# Patient Record
Sex: Male | Born: 1970 | Race: White | Hispanic: No | Marital: Married | Smoking: Former smoker
Health system: Southern US, Community
[De-identification: ages and names within clinical notes are randomized; demographics above are authoritative.]

## PROBLEM LIST (undated history)

## (undated) DIAGNOSIS — E039 Hypothyroidism, unspecified: Secondary | ICD-10-CM

## (undated) HISTORY — PX: TOTAL HIP ARTHROPLASTY: SHX124

## (undated) HISTORY — PX: THYROID SURGERY: SHX805

---

## 2017-04-02 ENCOUNTER — Emergency Department (HOSPITAL_COMMUNITY)
Admission: EM | Admit: 2017-04-02 | Discharge: 2017-04-03 | Disposition: A | Discharge status: Home / Self Care | Payer: Medicaid - Out of State | Attending: Emergency Medicine | Admitting: Emergency Medicine

## 2017-04-02 ENCOUNTER — Encounter (HOSPITAL_COMMUNITY): Payer: Self-pay | Admitting: Emergency Medicine

## 2017-04-02 DIAGNOSIS — Z79899 Other long term (current) drug therapy: Secondary | ICD-10-CM | POA: Insufficient documentation

## 2017-04-02 DIAGNOSIS — Z96643 Presence of artificial hip joint, bilateral: Secondary | ICD-10-CM | POA: Insufficient documentation

## 2017-04-02 DIAGNOSIS — M25552 Pain in left hip: Secondary | ICD-10-CM | POA: Diagnosis not present

## 2017-04-02 HISTORY — DX: Hypothyroidism, unspecified: E03.9

## 2017-04-02 NOTE — ED Triage Notes (Signed)
Patient has left upper leg pain, no current injury, had bilateral hip replacement approx 19 months ago.

## 2017-04-03 ENCOUNTER — Emergency Department (HOSPITAL_COMMUNITY): Payer: Medicaid - Out of State

## 2017-04-03 MED ORDER — TRAMADOL HCL 50 MG PO TABS
50.0000 mg | ORAL_TABLET | Freq: Once | ORAL | Status: AC
  Administered 2017-04-03: 50 mg via ORAL
  Filled 2017-04-03: qty 1

## 2017-04-03 MED ORDER — TRAMADOL HCL 50 MG PO TABS: 50.0000 mg | ORAL_TABLET | Freq: Four times a day (QID) | ORAL | 0 refills | Status: AC | PRN

## 2017-04-03 NOTE — ED Provider Notes (Signed)
[facility] Provider Note   CSN: [id number] Arrival date & time: 04/02/17  2320     History   Chief Complaint Chief Complaint  Patient presents with  . Leg Pain    HPI Perry Harrison is a 46 y.o. male.  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 This was done the end of 2016 by an orthopedic surgeon in [place]. He presents here this evening complaining of increased pain in his left hip and thigh that began several weeks ago in the absence of any injury or trauma. He reports having difficulty ambulating. He tells me he has been "eating ibuprofen" however this is not helping. He tells me he has seen his orthopedist who has told him to "just take Tylenol", and has not been given an explanation for his pain.   The history is provided by the patient.  Leg Pain   This is a new problem. Episode onset: 3 weeks ago. The problem occurs constantly. The problem has been gradually worsening. Pain location: left hip. The pain is moderate. Pertinent negatives include no numbness. Treatments tried: ibuprofen. The treatment provided no relief.    Past Medical History:  Diagnosis Date  . Hypothyroidism     There are no active problems to display for this patient.   Past Surgical History:  Procedure Laterality Date  . THYROID SURGERY    . TOTAL HIP ARTHROPLASTY Bilateral        Home Medications    Prior to Admission medications   Medication Sig Start Date End Date Taking? Authorizing Provider  levothyroxine (SYNTHROID, LEVOTHROID) 200 MCG tablet Take 200 mcg by mouth daily before breakfast.   Yes [provider]    Family History Family History  Problem Relation Age of Onset  . Cancer Mother   . Hypertension Mother   . Seizures Mother   . Thyroid disease Mother   . Hypertension Father     Social History Social History  Substance Use Topics  . Smoking status:  Former Games developer  . Smokeless tobacco: Never Used  . Alcohol use No     Allergies   Patient has no known allergies.   Review of Systems Review of Systems  Neurological: Negative for numbness.  All other systems reviewed and are negative.    Physical Exam Updated Vital Signs BP 136/81 (BP Location: Left Arm)   Pulse 69   Temp 97.6 F (36.4 C) (Oral)   Resp 18   Ht  (1.93 m)   Wt (!) 138.3 kg (305 lb)   SpO2 97%   BMI 37.13 kg/m   Physical Exam  Constitutional: He is oriented to person, place, and time. He appears well-developed and well-nourished. No distress.  HENT:  Head: Normocephalic and atraumatic.  Neck: Normal range of motion. Neck supple.  Musculoskeletal:  The left hip appears grossly normal. There is no redness, swelling, or erythema. He reports pain with range of motion. DP pulses are palpable and motor and sensation are intact to the bilateral feet.  Neurological: He is alert and oriented to person, place, and time.  Skin: Skin is warm and dry. He is not diaphoretic.  Nursing note and vitals reviewed.    ED Treatments / Results  Labs (all labs ordered are listed, but only abnormal results are displayed) Labs Reviewed - No data to display  EKG  EKG Interpretation None       Radiology No  results found.  Procedures Procedures (including critical care time)  Medications Ordered in ED Medications - No data to display   Initial Impression / Assessment and Plan / ED Course  I have reviewed the triage vital signs and the nursing notes.  Pertinent labs & imaging results that were available during my care of the patient were reviewed by me and considered in my medical decision making (see chart for details).  Patient with history of bilateral hip replacements secondary to AVN presenting with left hip pain that has been ongoing for the past three months. This is been unexplained by his orthopedic surgeon. X-rays this evening reveal the hardware  to be in place with no evidence of failure. His physical examination is otherwise unremarkable I'm uncertain as to the etiology of this patient's pain, however I will recommend anti-inflammatory medicine. He will be prescribed tramadol and is to follow-up with orthopedics.  Final Clinical Impressions(s) / ED Diagnoses   Final diagnoses:  None    New Prescriptions New Prescriptions   No medications on file     Geoffery Lyons, MD 04/03/17 0117

## 2017-04-03 NOTE — Discharge Instructions (Signed)
Ibuprofen 600 mg 3 times daily for the next 5 days.  Tramadol as prescribed as needed for pain not relieved with ibuprofen.  Follow-up with orthopedic surgery if you're not improving in the next week.

## 2018-08-01 IMAGING — DX DG HIP (WITH OR WITHOUT PELVIS) 2-3V*L*
3 series · 3 of 3 positions shown · non-contrast
Comparison: None.

CLINICAL DATA: Left upper leg pain.

EXAM:
DG HIP (WITH OR WITHOUT PELVIS) 2-3V LEFT

[pelvis ap]
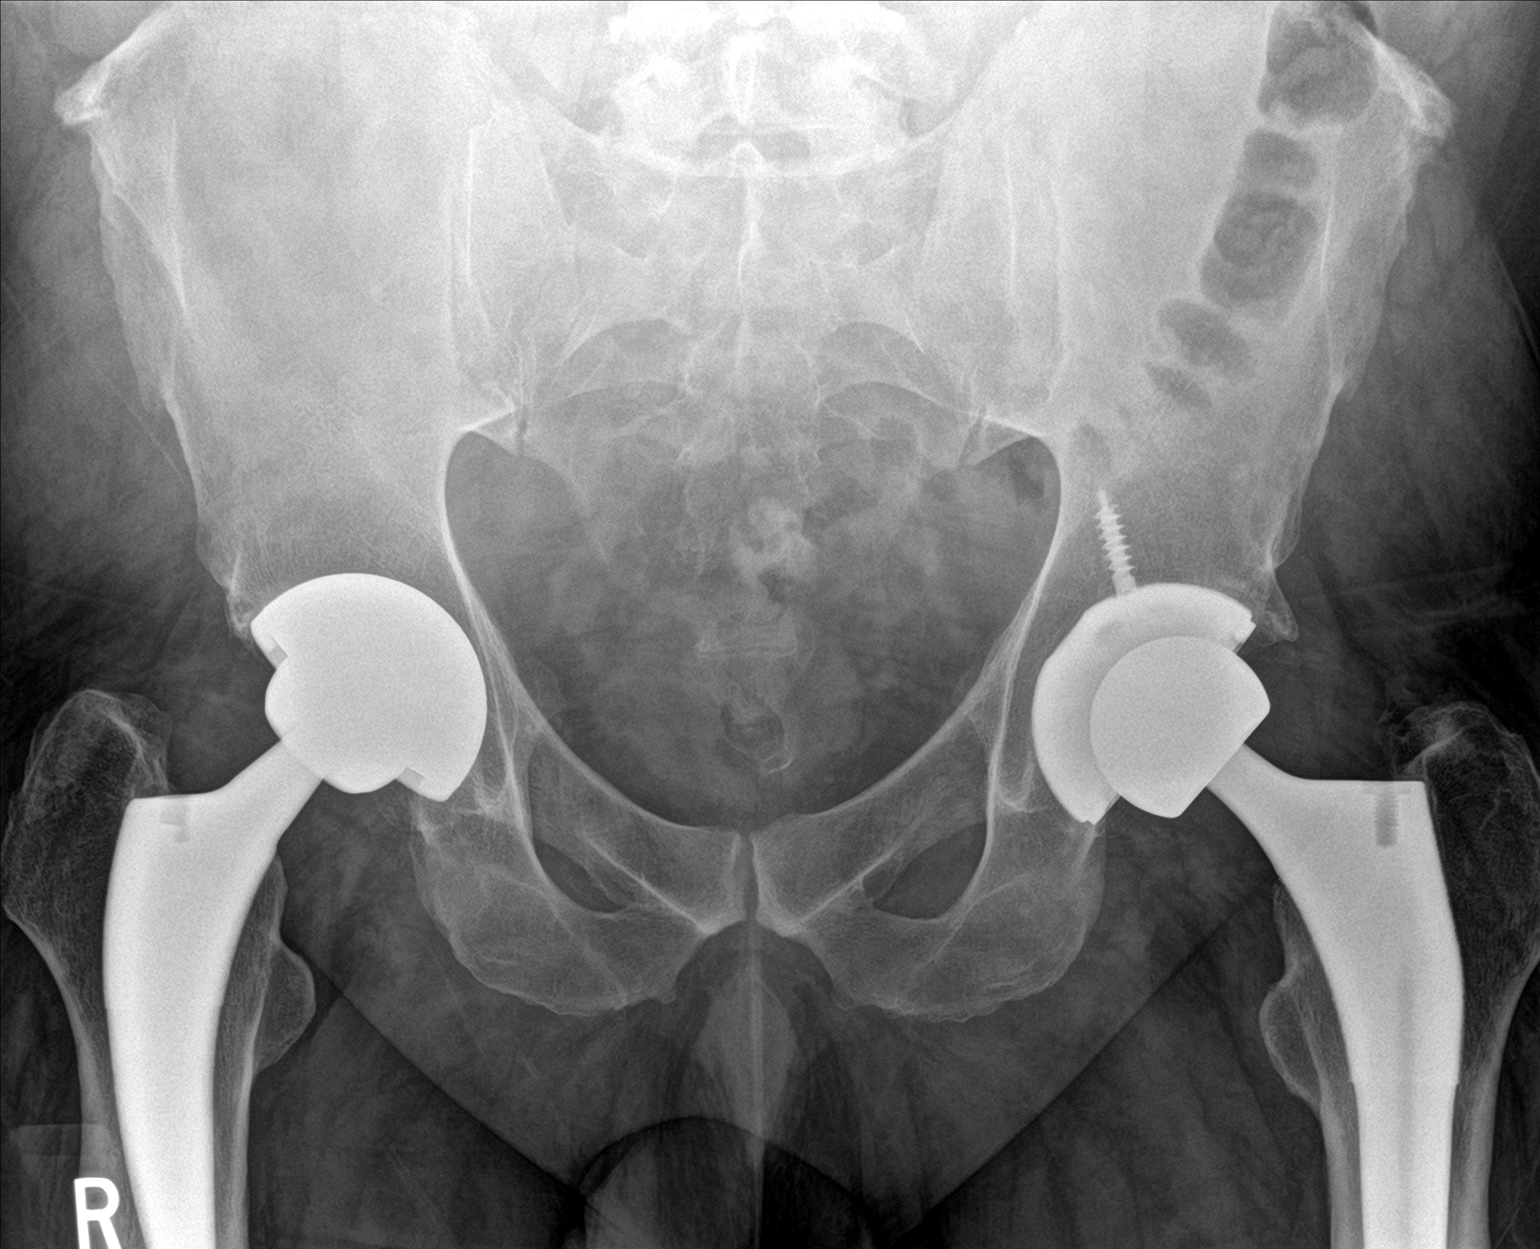

[hip ap]
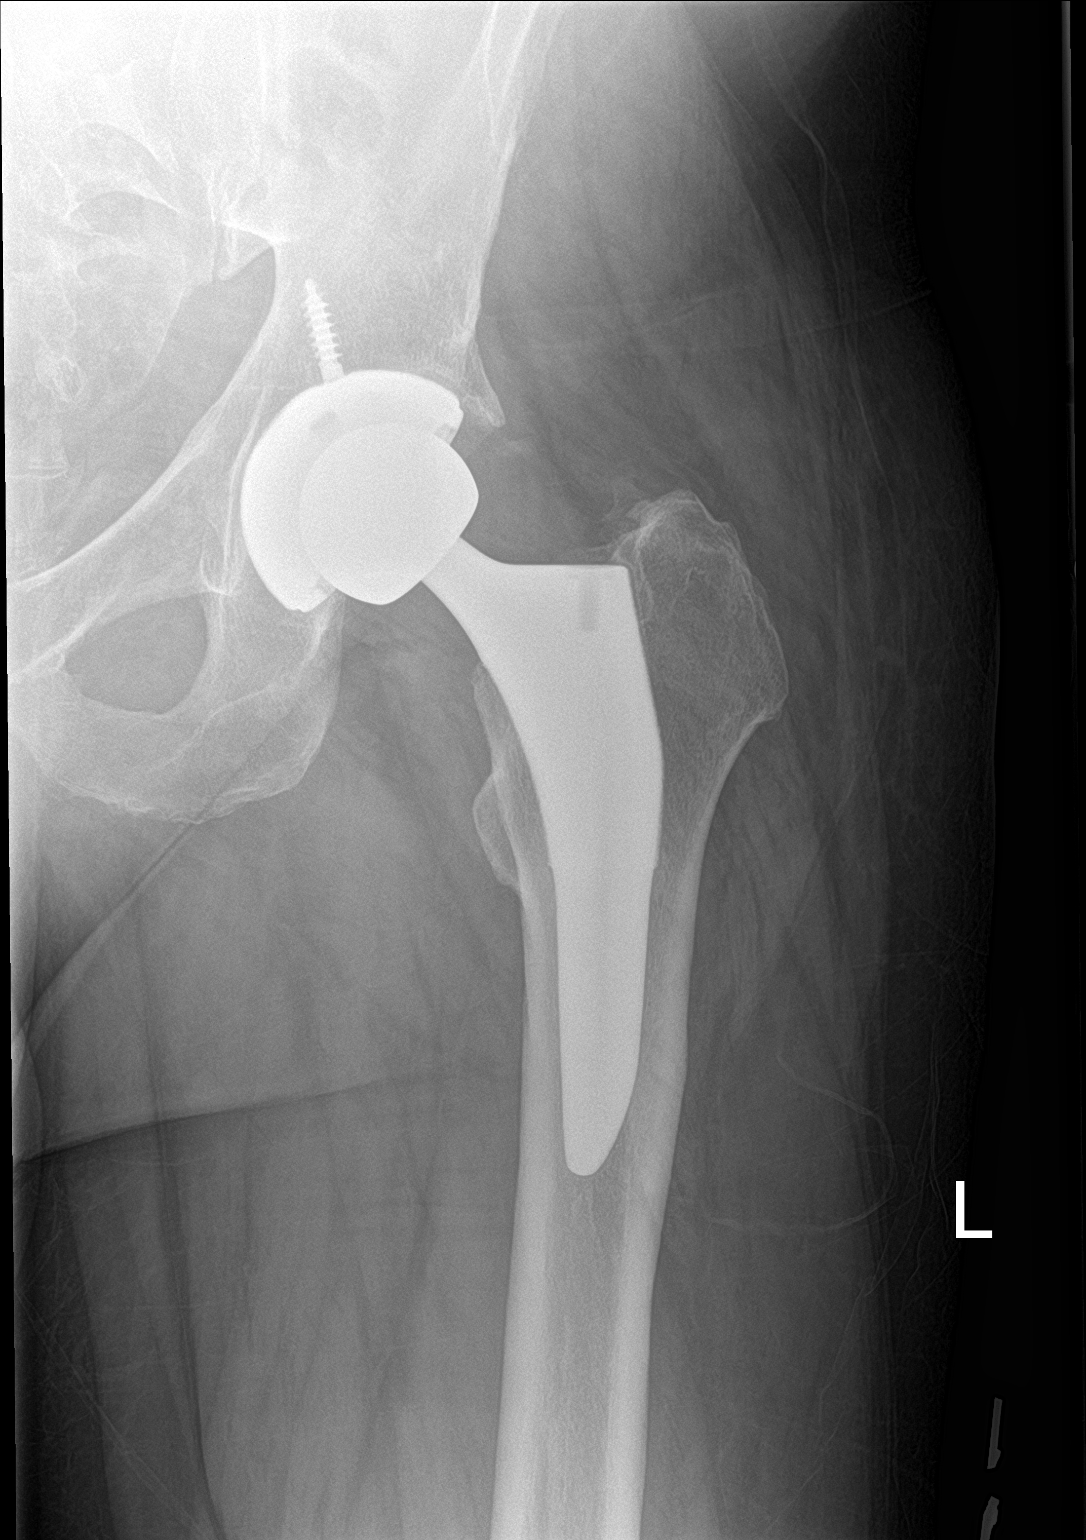

[hip lat]
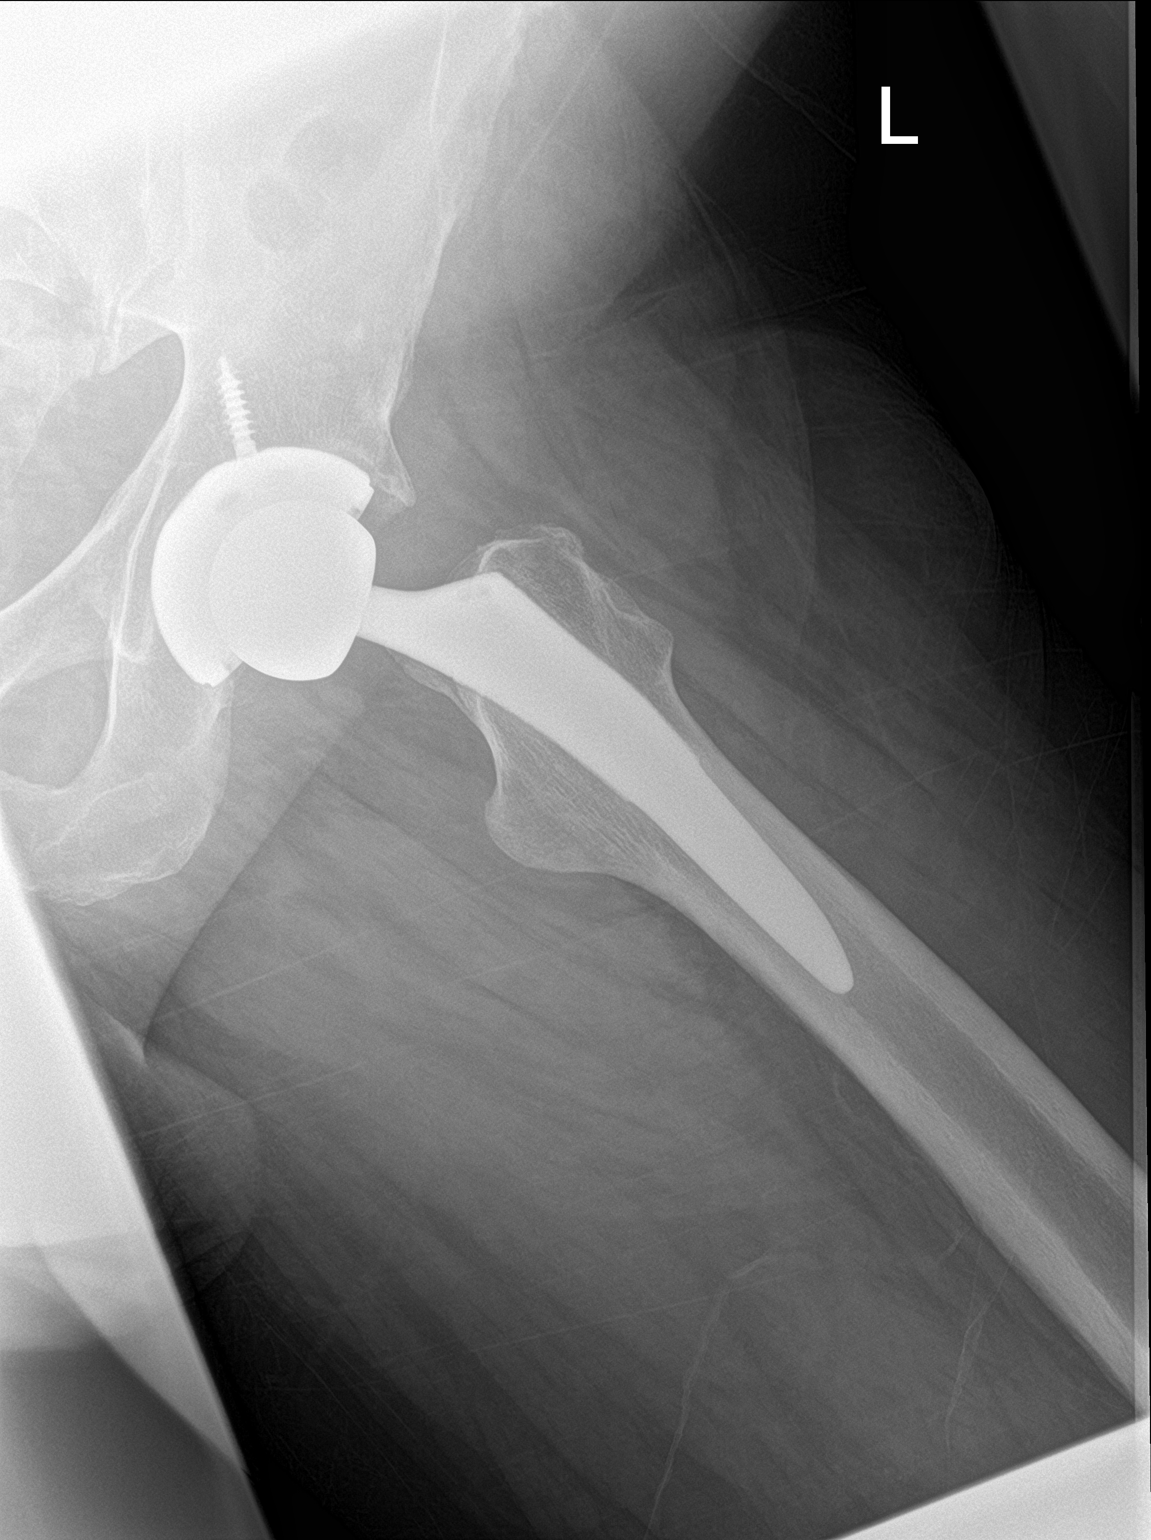

[3 of 3 positions shown; findings below may reference images not displayed]

FINDINGS: There is no evidence of hip fracture or dislocation. Bilateral
uncemented hip arthroplasties are noted without evidence of hardware
failure or malalignment. There is no evidence of arthropathy or
other focal bone abnormality.
IMPRESSION: Intact left hip arthroplasty with uncemented femoral component. No
apparent hardware failure or fracture.

## 2022-09-18 ENCOUNTER — Encounter: Payer: Self-pay | Admitting: Gastroenterology

## 2022-11-15 ENCOUNTER — Ambulatory Visit: Payer: Medicaid - Out of State | Admitting: Gastroenterology

## 2023-01-25 ENCOUNTER — Telehealth: Payer: Self-pay

## 2023-01-25 NOTE — Telephone Encounter (Signed)
Inbound call from patient stating he is unable to receive records from last ER visit. States he returned from out of town today and will not be able to get them before tomorrow 7/19 appointment. Please advise, thank you.

## 2023-01-25 NOTE — Progress Notes (Signed)
01/26/2023 Perry Harrison [id number] 10-29-1970  Referring provider: No ref. provider found Primary GI doctor: Dr. Meridee Score  ASSESSMENT AND PLAN:   Gastroesophageal reflux disease with nausea, vomiting,AB epigastric pain Normal CT, RUQ [name] from patient's phone but will try to get medical request from [facility] Lifestyle changes discussed, avoid NSAIDS, ETOH Weight loss discussed with the patient Smoking cessation discussed in detail Refilled pantoprazole 40 mg daily emphasized PPI 30 minutes to an hour before food Will get Diatherix stool antigen for H. Pylori Will schedule EGD to evaluate for PUD, gastritis, esophagitis, etc I discussed risks of EGD with patient today, including risk of sedation, bleeding or perforation.  Patient provides understanding and gave verbal consent to proceed. consider HIDA if EGD negative Discussed marijuana cessation and cannaboid hyperemsis.   Special screening for malignant neoplasms, colon No symptoms We have discussed the risks of bleeding, infection, perforation, medication reactions, and remote risk of death associated with colonoscopy. All questions were answered and the patient acknowledges these risk and wishes to proceed.  Fatty liver - need LFTs and CBC monitored every 6 months, revaluation every 2-3 years.  - Consider elastography --Continue to work on risk factor modification including diet exercise and control of risk factors including blood sugars    Patient Care Team: Patient, No Pcp Per as PCP - General (General Practice)  HISTORY OF PRESENT ILLNESS: Perry Harrison with a past medical history of GERD, obesity, OSA not on CPAP, aquired hypothyroid secondary to thyroid surgery for CA 2011 tand others listed below presents for evaluation of AB pain, sweating, vomiting.  He has never had colonoscopy.  Patient denies family history of colon cancer or other gastrointestinal malignancies. He denies blood thinner use.  He reports  NSAID use was taking it daily until 2 years ago, stopped after bialteral hips, neck C3-C7 and lumbar L4-L5.  He denies ETOH use.   He denies tobacco use, Q 2012. Smoked for 20 years. He reports drug use, has medical card in [place] for marijuana, as needed for pain, not daily.   I do not have results from [facility] [facility], but on patient's phone he has CT AB and pelvis on 09/14/22 for epastric pain showed normal GB, normal liver, normal ducts, No specific issues. Did show cyst on right kdiney.  AB US showed fatty liver but normal gallbladder.   He has been having intermittent AB pain, nausea, vomiting and sweating for about a year. Would happen once every 2 weeks.  He was under a lot of stress and admitted to poor eating, like eating extra hot wings at 10 pm with nausea/vomiting/AB pain the whole next day.  Would have epigastric pain, no radiation.  No dysphagia, no melena.  He has taken pantoprazole and carafate for 4 weeks and states he has not had another episode since April 12th.  He has stopped snacking, stopped soda, better eating habits.  He has BM daily, no hematochezia.  Mom, MGM with gallbladder issues.   He  reports that he has quit smoking. He has never used smokeless tobacco. He reports that he does not drink alcohol and does not use drugs.      Current Medications:   Current Outpatient Medications (Endocrine & Metabolic):    levothyroxine (SYNTHROID, LEVOTHROID) 200 MCG tablet, Take 200 mcg by mouth daily before breakfast.    Current Outpatient Medications (Analgesics):    traMADol (ULTRAM) 50 MG tablet, Take 1 tablet (50 mg total) by mouth every 6 (six) hours as needed.  Current Outpatient Medications (Other):    citalopram (CELEXA) 20 MG tablet, Take 20 mg by mouth daily.   pantoprazole (PROTONIX) 40 MG tablet, Take 1 tablet (40 mg total) by mouth daily.   Medical History:  Past Medical History:  Diagnosis Date   Hypothyroidism    Allergies: No Known Allergies    Surgical History:  He  has a past surgical history that includes Total hip arthroplasty (Bilateral) and Thyroid surgery. Family History:  His family history includes Cancer in his mother; Hypertension in his father and mother; Seizures in his mother; Thyroid disease in his mother.  REVIEW OF SYSTEMS  : All other systems reviewed and negative except where noted in the History of Present Illness.  PHYSICAL EXAM: BP 120/74 (BP Location: Left Arm, Patient Position: Sitting, Cuff Size: Large)   Pulse (!) 56   Ht 6\' 2"  (1.88 m)   Wt (!) 317 lb (143.8 kg)   BMI 40.70 kg/m  General Appearance: Well nourished, in no apparent distress. Head:   Normocephalic and atraumatic. Eyes:  sclerae anicteric,conjunctive pink  Respiratory: Respiratory effort normal, BS equal bilaterally without rales, rhonchi, wheezing. Cardio: RRR with no MRGs. Peripheral pulses intact.  Abdomen: Soft,  Obese ,active bowel sounds. Mild to moderate tenderness in the epigastrium, negative murphy. Without guarding and Without rebound. No masses. Rectal: Not evaluated Musculoskeletal: Full ROM, Normal gait. Without edema. Skin:  Dry and intact without significant lesions or rashes Neuro: Alert and  oriented x4;  No focal deficits. Psych:  Cooperative. Normal mood and affect.     Perry Albee, PA-C 2:30 PM

## 2023-01-25 NOTE — Telephone Encounter (Signed)
Called patient and was unable to leave voicemail, called spouse and left voicemail requesting for Zekiel to bring any recent lab work or test results from his last ER visit

## 2023-01-26 ENCOUNTER — Encounter: Payer: Self-pay | Admitting: Physician Assistant

## 2023-01-26 ENCOUNTER — Ambulatory Visit (INDEPENDENT_AMBULATORY_CARE_PROVIDER_SITE_OTHER): Payer: Medicaid Other | Admitting: Physician Assistant

## 2023-01-26 VITALS — BP 120/74 | HR 56 | Ht 74.0 in | Wt 317.0 lb

## 2023-01-26 DIAGNOSIS — Z1211 Encounter for screening for malignant neoplasm of colon: Secondary | ICD-10-CM | POA: Diagnosis not present

## 2023-01-26 DIAGNOSIS — K219 Gastro-esophageal reflux disease without esophagitis: Secondary | ICD-10-CM | POA: Diagnosis not present

## 2023-01-26 DIAGNOSIS — R112 Nausea with vomiting, unspecified: Secondary | ICD-10-CM | POA: Diagnosis not present

## 2023-01-26 DIAGNOSIS — R1013 Epigastric pain: Secondary | ICD-10-CM

## 2023-01-26 DIAGNOSIS — K760 Fatty (change of) liver, not elsewhere classified: Secondary | ICD-10-CM

## 2023-01-26 MED ORDER — PANTOPRAZOLE SODIUM 40 MG PO TBEC: 40.0000 mg | DELAYED_RELEASE_TABLET | Freq: Every day | ORAL | 0 refills | Status: AC

## 2023-01-26 MED ORDER — NA SULFATE-K SULFATE-MG SULF 17.5-3.13-1.6 GM/177ML PO SOLN: 1.0000 | Freq: Once | ORAL | 0 refills | Status: AC

## 2023-01-26 NOTE — Patient Instructions (Addendum)
You have been scheduled for a colonoscopy. Please follow written instructions given to you at your visit today.   Please pick up your prep supplies at the pharmacy within the next 1-3 days.  If you use inhalers (even only as needed), please bring them with you on the day of your procedure.  DO NOT TAKE 7 DAYS PRIOR TO TEST- Trulicity (dulaglutide) Ozempic, Wegovy (semaglutide) Mounjaro (tirzepatide) Bydureon Bcise (exanatide extended release)  DO NOT TAKE 1 DAY PRIOR TO YOUR TEST Rybelsus (semaglutide) Adlyxin (lixisenatide) Victoza (liraglutide) Byetta (exanatide) ___________________________________________________________________________  please take your proton pump inhibitor medication, pantorprazole 40 mg daily  Please take this medication 30 minutes to 1 hour before meals- this makes it more effective.  Avoid spicy and acidic foods Avoid fatty foods Limit your intake of coffee, tea, alcohol, and carbonated drinks Work to maintain a healthy weight Keep the head of the bed elevated at least 3 inches with blocks or a wedge pillow if you are having any nighttime symptoms Stay upright for 2 hours after eating Avoid meals and snacks three to four hours before bedtime  May need to stop marijuana for 9 months to rule out cannabidiol hyperemsis    Metabolic dysfunction associated seatohepatitis  Now the leading cause of liver failure in the [place].  It is normally from such risk factors as obesity, diabetes, insulin resistance, high cholesterol, or metabolic syndrome.  The only definitive therapy is weight loss and exercise.   Suggest walking 20-30 mins daily.  Decreasing carbohydrates, increasing veggies.    Fatty Liver Fatty liver is the accumulation of fat in liver cells. It is also called hepatosteatosis or steatohepatitis. It is normal for your liver to contain some fat. If fat is more than 5 to 10% of your liver's weight, you have fatty liver.  There are often no  symptoms (problems) for years while damage is still occurring. People often learn about their fatty liver when they have medical tests for other reasons. Fat can damage your liver for years or even decades without causing problems. When it becomes severe, it can cause fatigue, weight loss, weakness, and confusion. This makes you more likely to develop more serious liver problems. The liver is the largest organ in the body. It does a lot of work and often gives no warning signs when it is sick until late in a disease. The liver has many important jobs including: Breaking down foods. Storing vitamins, iron, and other minerals. Making proteins. Making bile for food digestion. Breaking down many products including medications, alcohol and some poisons.  PROGNOSIS  Fatty liver may cause no damage or it can lead to an inflammation of the liver. This is, called steatohepatitis.  Over time the liver may become scarred and hardened. This condition is called cirrhosis. Cirrhosis is serious and may lead to liver failure or cancer. NASH is one of the leading causes of cirrhosis. About 10-20% of Americans have fatty liver and a smaller 2-5% has NASH.  TREATMENT  Weight loss, fat restriction, and exercise in overweight patients produces inconsistent results but is worth trying. Good control of diabetes may reduce fatty liver. Eat a balanced, healthy diet. Increase your physical activity. There are no medical or surgical treatments for a fatty liver or NASH, but improving your diet and increasing your exercise may help prevent or reverse some of the damage.  _______________________________________________________  If your blood pressure at your visit was 140/90 or greater, please contact your primary care physician to follow up  on this.  _______________________________________________________  If you are age 51 or older, your body mass index should be between 23-30. Your Body mass index is 40.7 kg/m. If  this is out of the aforementioned range listed, please consider follow up with your Primary Care Provider.  If you are age 51 or younger, your body mass index should be between 19-25. Your Body mass index is 40.7 kg/m. If this is out of the aformentioned range listed, please consider follow up with your Primary Care Provider.   ________________________________________________________  The [facility] providers would like to encourage you to use [facility] to communicate with providers for non-urgent requests or questions.  Due to long hold times on the telephone, sending your provider a message by [facility] may be a faster and more efficient way to get a response.  Please allow 48 business hours for a response.  Please remember that this is for non-urgent requests.  _______________________________________________________ It was a pleasure to see you today!  Thank you for trusting me with your gastrointestinal care!

## 2023-01-26 NOTE — Progress Notes (Signed)
Attending Physician's Attestation   I have reviewed the chart.   I agree with the Advanced Practitioner's note, impression, and recommendations with any updates as below.    Gabriel Mansouraty, MD [facility] Office # [phone]  

## 2023-03-23 ENCOUNTER — Encounter: Payer: Medicaid - Out of State | Admitting: Gastroenterology
